# Patient Record
Sex: Male | Born: 1970 | Race: Black or African American | Hispanic: No | Marital: Single | Smoking: Never smoker
Health system: Southern US, Community
[De-identification: ages and names within clinical notes are randomized; demographics above are authoritative.]

## PROBLEM LIST (undated history)

## (undated) DIAGNOSIS — E119 Type 2 diabetes mellitus without complications: Secondary | ICD-10-CM

## (undated) HISTORY — PX: HERNIA REPAIR: SHX51

## (undated) HISTORY — PX: OTHER SURGICAL HISTORY: SHX169

---

## 2014-08-05 ENCOUNTER — Ambulatory Visit: Payer: Self-pay

## 2014-08-05 ENCOUNTER — Other Ambulatory Visit: Payer: Self-pay | Admitting: Occupational Medicine

## 2014-08-05 DIAGNOSIS — M25571 Pain in right ankle and joints of right foot: Secondary | ICD-10-CM
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
## 2016-07-14 ENCOUNTER — Encounter (HOSPITAL_BASED_OUTPATIENT_CLINIC_OR_DEPARTMENT_OTHER): Payer: Self-pay | Admitting: *Deleted

## 2016-07-14 ENCOUNTER — Emergency Department (HOSPITAL_BASED_OUTPATIENT_CLINIC_OR_DEPARTMENT_OTHER)
Admission: EM | Admit: 2016-07-14 | Discharge: 2016-07-14 | Disposition: A | Discharge status: Home / Self Care | Payer: Managed Care, Other (non HMO) | Attending: Emergency Medicine | Admitting: Emergency Medicine

## 2016-07-14 DIAGNOSIS — T1591XA Foreign body on external eye, part unspecified, right eye, initial encounter: Secondary | ICD-10-CM | POA: Insufficient documentation

## 2016-07-14 DIAGNOSIS — Y999 Unspecified external cause status: Secondary | ICD-10-CM | POA: Insufficient documentation

## 2016-07-14 DIAGNOSIS — E119 Type 2 diabetes mellitus without complications: Secondary | ICD-10-CM | POA: Diagnosis not present

## 2016-07-14 DIAGNOSIS — Y929 Unspecified place or not applicable: Secondary | ICD-10-CM | POA: Insufficient documentation

## 2016-07-14 DIAGNOSIS — X58XXXA Exposure to other specified factors, initial encounter: Secondary | ICD-10-CM | POA: Insufficient documentation

## 2016-07-14 DIAGNOSIS — Y939 Activity, unspecified: Secondary | ICD-10-CM | POA: Diagnosis not present

## 2016-07-14 DIAGNOSIS — H5711 Ocular pain, right eye: Secondary | ICD-10-CM | POA: Diagnosis present

## 2016-07-14 HISTORY — DX: Type 2 diabetes mellitus without complications: E11.9

## 2016-07-14 MED ORDER — TETRACAINE HCL 0.5 % OP SOLN
1.0000 [drp] | Freq: Once | OPHTHALMIC | Status: AC
  Administered 2016-07-14: 1 [drp] via OPHTHALMIC
  Filled 2016-07-14: qty 4

## 2016-07-14 MED ORDER — FLUORESCEIN SODIUM 1 MG OP STRP
1.0000 | ORAL_STRIP | Freq: Once | OPHTHALMIC | Status: AC
  Administered 2016-07-14: 1 via OPHTHALMIC
  Filled 2016-07-14: qty 1

## 2016-07-14 NOTE — ED Triage Notes (Signed)
Pt woke up this am feeling like "something is in my eye).  Pt denies any drainage or trauma.  It appears as though an area of the sclera is covering a little part of the iris ( white over some of the brown)
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
## 2021-12-05 ENCOUNTER — Other Ambulatory Visit (HOSPITAL_COMMUNITY): Payer: Self-pay | Admitting: Cardiovascular Disease

## 2021-12-05 ENCOUNTER — Other Ambulatory Visit: Payer: Self-pay | Admitting: Cardiovascular Disease

## 2021-12-05 DIAGNOSIS — R079 Chest pain, unspecified: Secondary | ICD-10-CM

## 2021-12-18 ENCOUNTER — Ambulatory Visit (HOSPITAL_COMMUNITY): Payer: Managed Care, Other (non HMO)

## 2021-12-18 ENCOUNTER — Ambulatory Visit (HOSPITAL_COMMUNITY): Admission: RE | Admit: 2021-12-18 | Payer: Managed Care, Other (non HMO) | Source: Ambulatory Visit

## 2021-12-28 ENCOUNTER — Ambulatory Visit (HOSPITAL_COMMUNITY)
Admission: RE | Admit: 2021-12-28 | Discharge: 2021-12-28 | Disposition: A | Discharge status: Home / Self Care | Payer: Self-pay | Source: Ambulatory Visit | Attending: Family Medicine | Admitting: Family Medicine

## 2021-12-28 ENCOUNTER — Other Ambulatory Visit: Payer: Self-pay

## 2021-12-28 DIAGNOSIS — R079 Chest pain, unspecified: Secondary | ICD-10-CM | POA: Insufficient documentation

## 2023-02-14 IMAGING — CT CT CARDIAC CORONARY ARTERY CALCIUM SCORE
3 series · 14 of 20 positions shown, 15 images · non-contrast
Comparison: CT November 02, 2018
COMPARISON: CT November 02, 2018

Addendum:
:
The following report is an over-read performed by radiologist Dr.
Eime Sta Ana [REDACTED] on December 28, 2021. This
over-read does not include interpretation of cardiac or coronary
anatomy or pathology. The coronary calcium score interpretation by
the cardiologist is attached.
TECHNIQUE: A gated, non-contrast computed tomography scan of the heart was
performed using 3mm slice thickness. Axial images were analyzed on a
dedicated workstation. Calcium scoring of the coronary arteries was
performed using the Agatston method.

[Series 3: ax ca scr 70% (id) · axial · 0.39mm/px · z∈[+1306,+1430]mm · 6 of 88 slices shown]
[im 13/88  vessel]
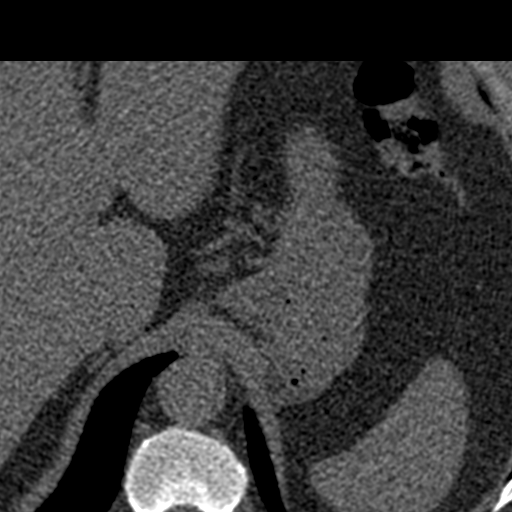
[im 25/88  vessel]
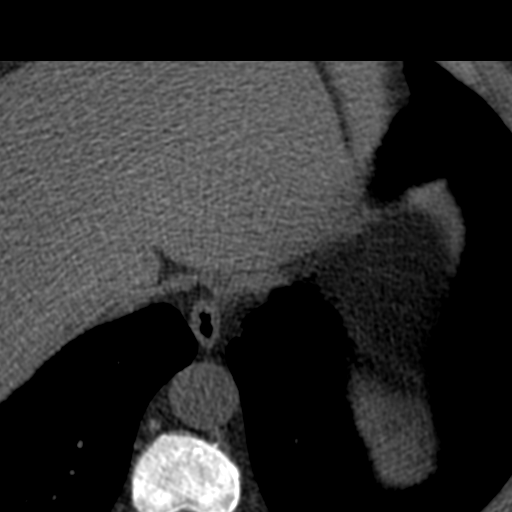
[im 38/88  vessel]
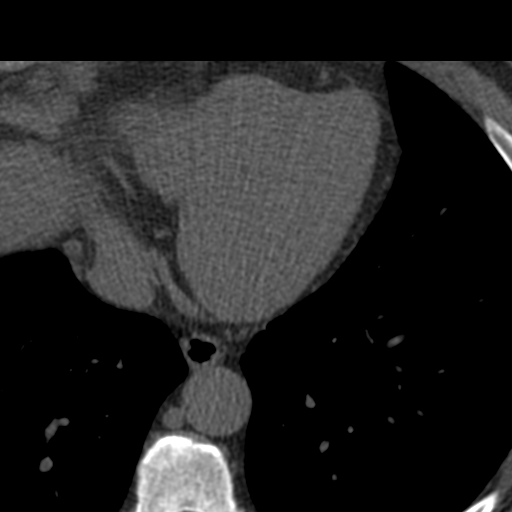
[im 50/88  vessel]
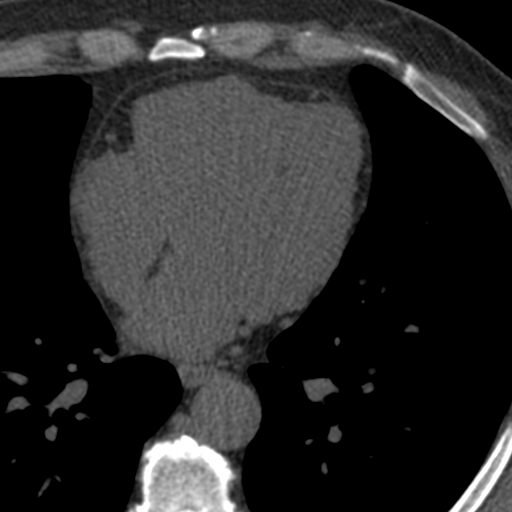
[im 63/88  vessel]
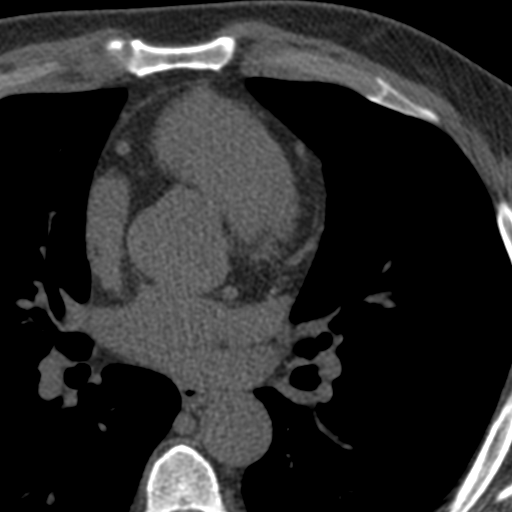
[im 75/88  vessel]
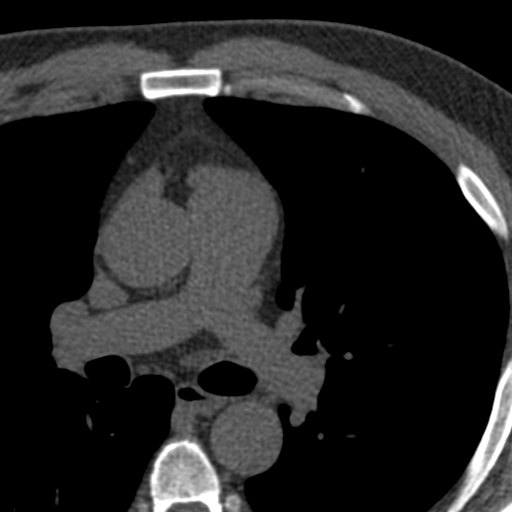

[Series 4: ax st · axial · 0.71mm/px · z∈[+1314,+1419]mm · 4 of 59 slices shown, 5 images]
[im 12/59  vessel]
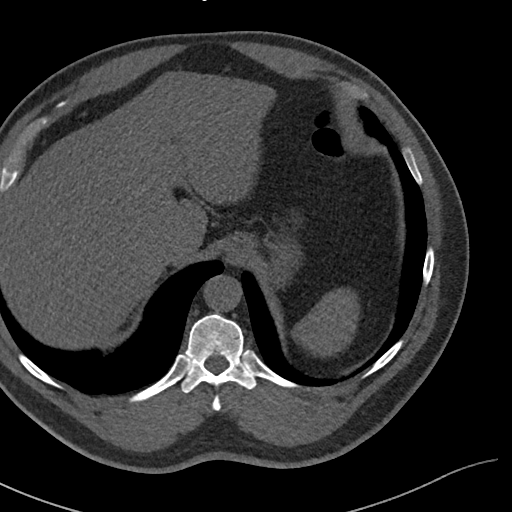
[im 12/59  lung]
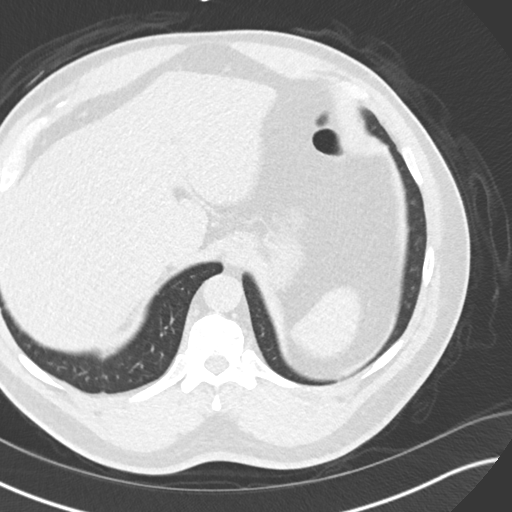
[im 24/59  vessel]
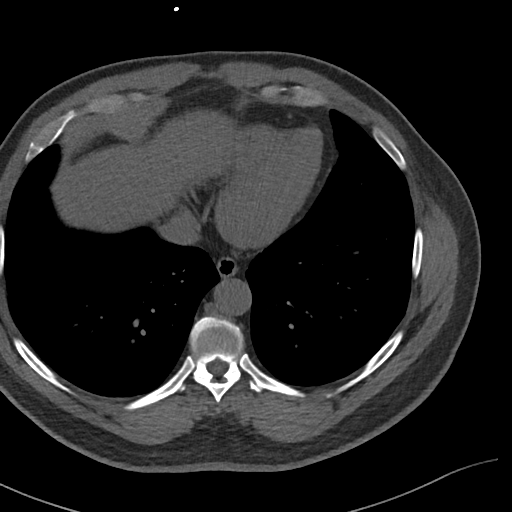
[im 35/59  vessel]
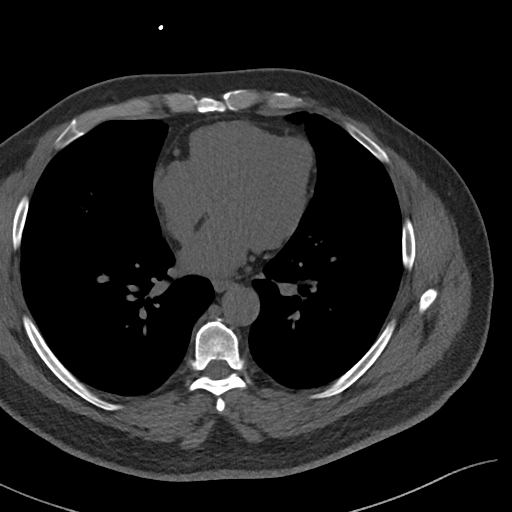
[im 47/59  vessel]
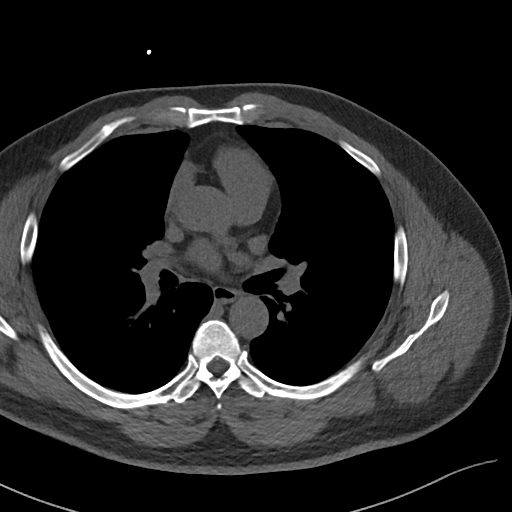

[Series 5: ax lung · axial · 0.71mm/px · z∈[+1314,+1419]mm · 4 of 59 slices shown]
[im 12/59  lung]
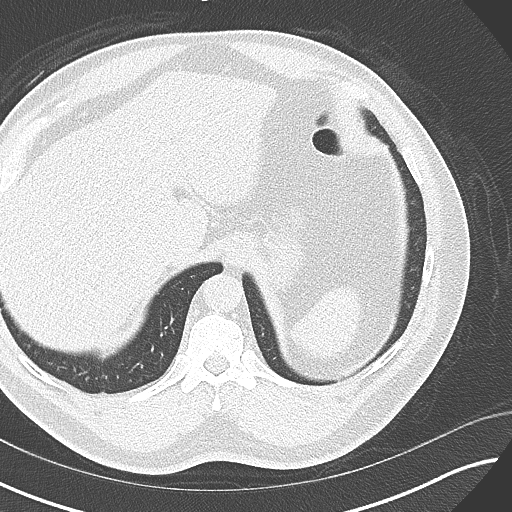
[im 24/59  lung]
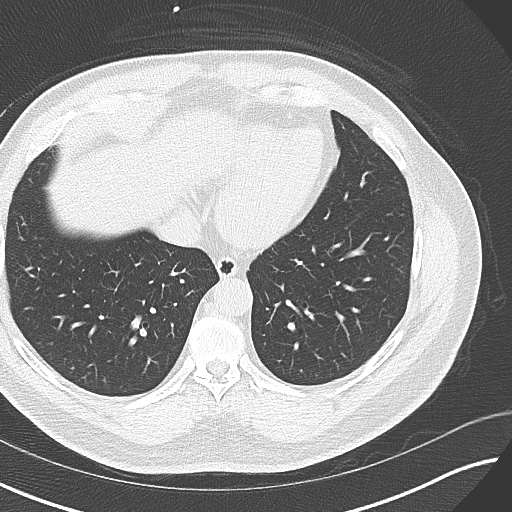
[im 35/59  lung]
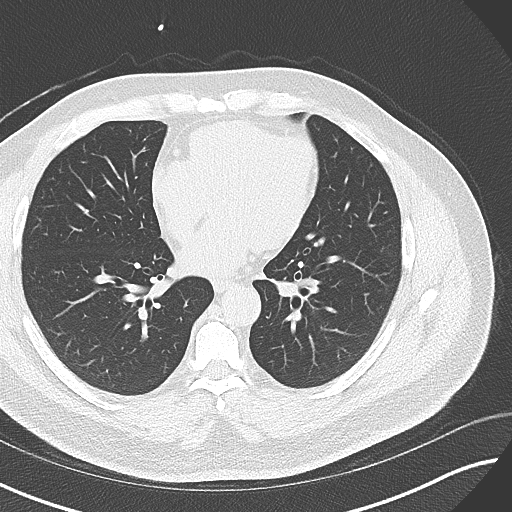
[im 47/59  lung]
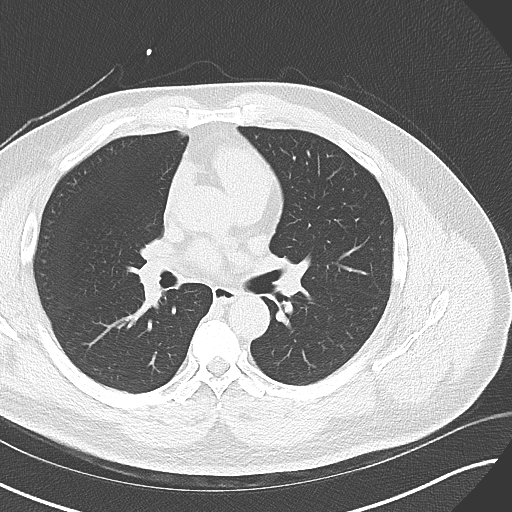

[14 of 20 positions shown; findings below may reference images not displayed]

FINDINGS: Vascular: No acute non-cardiac vascular finding.

Mediastinum/Nodes: No pathologically enlarged mediastinal, or hilar
lymph nodes, noting limited sensitivity for the detection of hilar
adenopathy on this noncontrast study. Visualized portions of the
esophagus are grossly unremarkable

Lungs/Pleura: Within the visualized portions of the thorax there are
no suspicious appearing pulmonary nodules or masses, there is no
acute
consolidative airspace disease, no pleural effusions and no
pneumothorax

Upper Abdomen: Right adrenal nodule measures 16 mm demonstrating
Hounsfield units of 4 consistent with a benign adenoma, and similar
in size to CT November 02, 2018. No acute abnormality.

Musculoskeletal: Thoracic spondylosis. There are no aggressive
appearing lytic or blastic lesions noted in the visualized portions
of the skeleton.
IMPRESSION: No significant incidental noncardiac finding noted in the thorax.

ADDENDUM:
Cardiovascular Disease Risk stratification

EXAM:
Coronary Calcium Score
FINDINGS: Coronary arteries: Normal origins.

Coronary Calcium Score:

Left main: 0

Left anterior descending artery:

Left circumflex artery: 0

Right coronary artery:

Total: 93

Percentile: 86

Pericardium: Normal.

Ascending Aorta: Normal caliber.

Non-cardiac: See separate report from [REDACTED].
IMPRESSION: Coronary calcium score of 93. This was 86 percentile for age-,
race-, and sex-matched controls.



If CAC=0, it is reasonable to withhold statin therapy and reassess
in 5 to 10 years, as long as higher risk conditions are absent
(diabetes mellitus, family history of premature CHD in first degree
relatives (males <55 years; females <65 years), cigarette smoking,
or LDL >=190 mg/dL).

If CAC is 1 to 99, it is reasonable to initiate statin therapy for
patients >=55 years of age.

If CAC is >=100 or >=75th percentile, it is reasonable to initiate
statin therapy at any age.

Cardiology referral should be considered for patients with CAC
scores >=400 or >=75th percentile.

*2604 AHA/ACC/AACVPR/AAPA/ABC/BEJKO/KNUDSON/TANU/Caplan/GARBER/BENONIE/TUAN
Guideline on the Management of Blood Cholesterol: A Report of the
American College of Cardiology/American Heart Association Task Force
on Clinical Practice Guidelines. J Am Coll Cardiol.
9031;73(24):4832-4553.

Christine Laure Stive, DO [REDACTED]

*** End of Addendum ***
:
The following report is an over-read performed by radiologist Dr.
Eime Sta Ana [REDACTED] on December 28, 2021. This
over-read does not include interpretation of cardiac or coronary
anatomy or pathology. The coronary calcium score interpretation by
the cardiologist is attached.
FINDINGS: Vascular: No acute non-cardiac vascular finding.

Mediastinum/Nodes: No pathologically enlarged mediastinal, or hilar
lymph nodes, noting limited sensitivity for the detection of hilar
adenopathy on this noncontrast study. Visualized portions of the
esophagus are grossly unremarkable

Lungs/Pleura: Within the visualized portions of the thorax there are
no suspicious appearing pulmonary nodules or masses, there is no
acute
consolidative airspace disease, no pleural effusions and no
pneumothorax

Upper Abdomen: Right adrenal nodule measures 16 mm demonstrating
Hounsfield units of 4 consistent with a benign adenoma, and similar
in size to CT November 02, 2018. No acute abnormality.

Musculoskeletal: Thoracic spondylosis. There are no aggressive
appearing lytic or blastic lesions noted in the visualized portions
of the skeleton.
IMPRESSION: No significant incidental noncardiac finding noted in the thorax.

## 2023-06-22 ENCOUNTER — Encounter (HOSPITAL_BASED_OUTPATIENT_CLINIC_OR_DEPARTMENT_OTHER): Payer: Self-pay | Admitting: Urology

## 2023-06-22 ENCOUNTER — Emergency Department (HOSPITAL_BASED_OUTPATIENT_CLINIC_OR_DEPARTMENT_OTHER)
Admission: EM | Admit: 2023-06-22 | Discharge: 2023-06-22 | Disposition: A | Discharge status: Home / Self Care | Payer: BC Managed Care – PPO | Attending: Emergency Medicine | Admitting: Emergency Medicine

## 2023-06-22 ENCOUNTER — Emergency Department (HOSPITAL_BASED_OUTPATIENT_CLINIC_OR_DEPARTMENT_OTHER): Payer: BC Managed Care – PPO

## 2023-06-22 ENCOUNTER — Other Ambulatory Visit: Payer: Self-pay

## 2023-06-22 DIAGNOSIS — I471 Supraventricular tachycardia, unspecified: Secondary | ICD-10-CM | POA: Insufficient documentation

## 2023-06-22 DIAGNOSIS — R7989 Other specified abnormal findings of blood chemistry: Secondary | ICD-10-CM

## 2023-06-22 DIAGNOSIS — R079 Chest pain, unspecified: Secondary | ICD-10-CM | POA: Diagnosis present

## 2023-06-22 LAB — CBC
HCT: 50.2 % (ref 39.0–52.0)
Hemoglobin: 16.6 g/dL (ref 13.0–17.0)
MCH: 28.9 pg (ref 26.0–34.0)
MCHC: 33.1 g/dL (ref 30.0–36.0)
MCV: 87.3 fL (ref 80.0–100.0)
Platelets: 216 10*3/uL (ref 150–400)
RBC: 5.75 MIL/uL (ref 4.22–5.81)
RDW: 14.2 % (ref 11.5–15.5)
WBC: 8.7 10*3/uL (ref 4.0–10.5)
nRBC: 0 % (ref 0.0–0.2)

## 2023-06-22 LAB — BASIC METABOLIC PANEL
Anion gap: 12 (ref 5–15)
BUN: 17 mg/dL (ref 6–20)
CO2: 24 mmol/L (ref 22–32)
Calcium: 9.2 mg/dL (ref 8.9–10.3)
Chloride: 100 mmol/L (ref 98–111)
Creatinine, Ser: 1.17 mg/dL (ref 0.61–1.24)
GFR, Estimated: >60 mL/min (ref >60)
Glucose, Bld: 217 mg/dL — ABNORMAL HIGH (ref 70–99)
Potassium: 3.3 mmol/L — ABNORMAL LOW (ref 3.5–5.1)
Sodium: 136 mmol/L (ref 135–145)

## 2023-06-22 LAB — TROPONIN I (HIGH SENSITIVITY)
Troponin I (High Sensitivity): 5 ng/L (ref <18)
Troponin I (High Sensitivity): 35 ng/L — ABNORMAL HIGH (ref <18)
Troponin I (High Sensitivity): 63 ng/L — ABNORMAL HIGH (ref <18)

## 2023-06-22 LAB — MAGNESIUM: Magnesium: 2 mg/dL (ref 1.7–2.4)

## 2023-06-22 MED ORDER — ADENOSINE 6 MG/2ML IV SOLN
INTRAVENOUS | Status: AC
  Filled 2023-06-22: qty 2

## 2023-06-22 MED ORDER — METOPROLOL SUCCINATE ER 25 MG PO TB24: 25.0000 mg | ORAL_TABLET | Freq: Every day | ORAL | 1 refills | Status: AC

## 2023-06-22 MED ORDER — METOPROLOL TARTRATE 5 MG/5ML IV SOLN
INTRAVENOUS | Status: AC
  Filled 2023-06-22: qty 5

## 2023-06-22 MED ORDER — METOPROLOL TARTRATE 5 MG/5ML IV SOLN
INTRAVENOUS | Status: AC | PRN
Start: 2023-06-22 — End: 2023-06-22
  Administered 2023-06-22: 5 mg via INTRAVENOUS

## 2023-06-22 NOTE — ED Notes (Signed)
ED Provider at bedside. 

## 2023-06-22 NOTE — ED Notes (Signed)
EDP at bedside and had pt  perform valsalva maneuver by blowing into syringe, while staff lifted BLE to 45 degree angle; HR decreased to 90s NSR and pt sts he feels better

## 2023-06-22 NOTE — Discharge Instructions (Addendum)
1.  Follow-up with your cardiologist for recheck of your symptoms and blood tests as soon as possible.  Please call their office tomorrow morning to discuss your visit to the emergency department and request a follow-up appointment.  2.  You have a condition called SVT.  This stands for supraventricular tachycardia.  This is a condition that can come and go. It not typically dangerous, but requires further treatment if it is recurrent or lasts for more than short periods of time.  Please try to avoid caffeine, alcohol, and nicotine or smoking, which are all stimulants and can irritate the heart  3.  Return to the emergency department immediately if you have recurrence of this condition and you are feeling shortness of breath, chest pain, lightheadedness or other concerning symptoms.    Review the instructions about SVT.  There are some things you can try at home to see if you can resolve it before immediately coming into the hospital.  However, if it is persisting and you feel the symptoms you should return immediately.  4.  You have been prescribed a medication called Toprol to start taking.  Monitor your heart rate and your blood pressure.  This will make your heart rate slowed down and your blood pressure lower.  If your heart rate is too slow or your blood pressure is too low you may feel lightheaded and dizzy.  Do not take the medication if you are experiencing these symptoms.

## 2023-06-22 NOTE — ED Triage Notes (Addendum)
Pt states left sided chest pain that started appox 1 hr pta , HR 170 on time of triage, pt diaphoretic  Provider made aware of VS  No cardiac history

## 2023-06-22 NOTE — ED Notes (Signed)
Graham crackers, PB and ginger ale provided to pt

## 2023-06-22 NOTE — ED Provider Notes (Signed)
[place] [facility] Provider Note   CSN: [id number] Arrival date & time: 06/22/23  1349     History  Chief Complaint  Patient presents with   Chest Pain    Christopher Vang is a 52 y.o. male.  HPI Patient reports he was out mowing his lawn when he started to feel lightheaded and some chest pain on the left side.  He reports this type of thing has happened before.  He gets a feeling of his heart racing and lightheaded.  He reports that usually resolves on its own pretty quickly.  He ports this is lasted longer.  Patient reports that he has been seen by cardiology.  He reports he has worn a heart monitor and they never detected anything.Marland Kitchen  He also reports he has had a cardiac stress test.  Patient reports that came back normal and he was told that his heart was good and that he was not experiencing heart problems when he was getting his symptoms.    Home Medications Prior to Admission medications   Not on File      Allergies    Patient has no known allergies.    Review of Systems   Review of Systems  Physical Exam Updated Vital Signs BP 109/86   Pulse 85   Temp 98 F (36.7 C)   Resp 20   Ht 5\' 5"  (1.651 m)   Wt 102.1 kg   SpO2 100%   BMI 37.44 kg/m  Physical Exam Constitutional:      Comments: Alert and nontoxic.  Diaphoretic.  No respiratory distress at rest.  HENT:     Mouth/Throat:     Mouth: Mucous membranes are moist.     Pharynx: Oropharynx is clear.  Eyes:     Extraocular Movements: Extraocular movements intact.  Cardiovascular:     Comments: Extreme tachycardia regular. Pulmonary:     Effort: Pulmonary effort is normal.     Breath sounds: Normal breath sounds.  Abdominal:     General: There is no distension.     Palpations: Abdomen is soft.     Tenderness: There is no abdominal tenderness.  Musculoskeletal:        General: No swelling. Normal range of motion.     Right lower leg: No edema.     Left lower leg:  No edema.  Skin:    General: Skin is warm.     Comments: Profusely diaphoretic.  Neurological:     General: No focal deficit present.     Mental Status: He is oriented to person, place, and time.     Motor: No weakness.     Coordination: Coordination normal.  Psychiatric:        Mood and Affect: Mood normal.     ED Results / Procedures / Treatments   Labs (all labs ordered are listed, but only abnormal results are displayed) Labs Reviewed  BASIC METABOLIC PANEL - Abnormal; Notable for the following components:      Result Value   Potassium 3.3 (*)    Glucose, Bld 217 (*)    All other components within normal limits  CBC  MAGNESIUM  TROPONIN I (HIGH SENSITIVITY)  TROPONIN I (HIGH SENSITIVITY)    EKG EKG Interpretation Date/Time:  Sunday June 22 2023 14:09:45 EDT Ventricular Rate:  94 PR Interval:  239 QRS Duration:  98 QT Interval:  335 QTC Calculation: 419 R Axis:   81  Text Interpretation: Sinus rhythm Prolonged PR  interval Probable left atrial enlargement Minimal ST depression, inferior leads Baseline wander in lead(s) V2 V4 V5 V6 Converted to SR, no acute ischemic appearance Confirmed by Arby Barrette [phone]) on 06/22/2023 2:43:46 PM  Radiology DG Chest Port 1 View  Result Date: 06/22/2023 CLINICAL DATA:  Left-sided chest pain for 1 hour. EXAM: PORTABLE CHEST 1 VIEW COMPARISON:  Chest radiograph dated 11/02/2018 FINDINGS: The heart size and mediastinal contours are within normal limits. Both lungs are clear. Chronic deformity of the right clavicle is redemonstrated. Defibrillator pads overlie the chest. IMPRESSION: No active cardiopulmonary disease. Electronically Signed   By: Romona Curls M.D.   On: 06/22/2023 14:55    Procedures Procedures   CRITICAL CARE Performed by: Arby Barrette   Total critical care time: 30 minutes  Critical care time was exclusive of separately billable procedures and treating other patients.  Critical care was necessary to  treat or prevent imminent or life-threatening deterioration.  Critical care was time spent personally by me on the following activities: development of treatment plan with patient and/or surrogate as well as nursing, discussions with consultants, evaluation of patient's response to treatment, examination of patient, obtaining history from patient or surrogate, ordering and performing treatments and interventions, ordering and review of laboratory studies, ordering and review of radiographic studies, pulse oximetry and re-evaluation of patient's condition.  Medications Ordered in ED Medications  metoprolol tartrate (LOPRESSOR) injection ( Intravenous Canceled Entry 06/22/23 1430)    ED Course/ Medical Decision Making/ A&P                                 Medical Decision Making Amount and/or Complexity of Data Reviewed Labs: ordered. Radiology: ordered.  Risk Prescription drug management.   Patient presents with chest discomfort and profusely diaphoretic.  He has elevated heart rate of the 170s.  Patient reports this was of acute onset.  Patient reports he had symptoms periodically in the past but they spontaneously resolved.  Patient described extensive cardiac evaluation with no diagnosed coronary artery disease or dysrhythmia.  First EKG is consistent with SVT versus A-fib rapid narrow complex 160-170.  Patient pads placed.  Peripheral IVs established.  Placed on oxygen.  Valsalva with leg raise employed.  Successful conversion to sinus rhythm.  Patient tolerated well.  repeat EKG shows sinus rhythm at about 90.  Will administer metoprolol 5 mg IV.  Troponin normal.  EKG does not show ischemic changes.  Patient is pain-free.  Will plan for second troponin.  Dr. Delight Hoh to follow-up on second troponin.  Patient mains pain-free and troponins plateau, patient will be appropriate for discharge.  He has had outpatient cardiac workup and will be able to follow-up.  To date he has not been  diagnosed with coronary artery disease.        Final Clinical Impression(s) / ED Diagnoses Final diagnoses:  SVT (supraventricular tachycardia)    Rx / DC Orders ED Discharge Orders     None         Arby Barrette, MD 06/22/23 1615

## 2023-06-22 NOTE — ED Provider Notes (Signed)
52 yo male presenting with chest pain onset today and SVT, converted with valsalva  Patient has cardiologist Pain free at this time Awaiting 2nd troponin  Physical Exam  BP (!) 131/98   Pulse 80   Temp 98.2 F (36.8 C) (Oral)   Resp 19   Ht 5\' 5"  (1.651 m)   Wt 102.1 kg   SpO2 100%   BMI 37.44 kg/m   Physical Exam  Procedures  Procedures  ED Course / MDM    Medical Decision Making Amount and/or Complexity of Data Reviewed Labs: ordered. Radiology: ordered. ECG/medicine tests: ordered.  Risk Prescription drug management.   Patient was monitored for 7 hours in the ED.  I reviewed his telemetry which did not show any arrhythmic events following his cardioversion from SVT.  He remained in a sinus rhythm.  His repeat EKG now after 7 hours continuous with a normal sinus rhythm not acute ischemic findings.  He has been chest pain-free since his cardioversion.  He did have a very minor increase in his troponin which I suspect is related to demand ischemia from his SVT, but I do not see a significant elevation or increase to raise concern for ACS.    Ultimately I engaged in shared decision making with the patient, and we have opted to discharge him home with close follow-up with his cardiologist.  We discussed some lifestyle modifications to make including discontinuing all nicotine products, cigar smoking, reducing or cutting out caffeine intake, and avoiding overexertion in the heat, as potential triggers for SVT.  Close return precautions were given if you were to have a new or developing chest pain.  He is stable otherwise for discharge   Terald Sleeper, MD 06/22/23 2104

## 2023-06-22 NOTE — ED Notes (Signed)
Gave patient ginger ale

## 2023-07-02 ENCOUNTER — Other Ambulatory Visit (HOSPITAL_COMMUNITY): Payer: Self-pay | Admitting: *Deleted

## 2023-07-02 DIAGNOSIS — R079 Chest pain, unspecified: Secondary | ICD-10-CM

## 2023-07-11 ENCOUNTER — Telehealth (HOSPITAL_COMMUNITY): Payer: Self-pay | Admitting: *Deleted

## 2023-07-11 NOTE — Telephone Encounter (Signed)
Reaching out to patient to offer assistance regarding upcoming cardiac imaging study; pt verbalizes understanding of appt date/time, parking situation and where to check in, pre-test NPO status and medications ordered, and verified current allergies; name and call back number provided for further questions should they arise Johney Frame RN Navigator Cardiac Imaging [facility] [phone] office [phone] cell   Instructions sent via email.

## 2023-07-14 ENCOUNTER — Ambulatory Visit (HOSPITAL_COMMUNITY)
Admission: RE | Admit: 2023-07-14 | Discharge: 2023-07-14 | Disposition: A | Discharge status: Home / Self Care | Payer: BC Managed Care – PPO | Source: Ambulatory Visit | Attending: Family Medicine | Admitting: Family Medicine

## 2023-07-14 DIAGNOSIS — R079 Chest pain, unspecified: Secondary | ICD-10-CM | POA: Diagnosis present

## 2023-07-14 DIAGNOSIS — I2510 Atherosclerotic heart disease of native coronary artery without angina pectoris: Secondary | ICD-10-CM | POA: Diagnosis not present

## 2023-07-14 MED ORDER — NITROGLYCERIN 0.4 MG SL SUBL
0.8000 mg | SUBLINGUAL_TABLET | Freq: Once | SUBLINGUAL | Status: AC
  Administered 2023-07-14: 0.8 mg via SUBLINGUAL

## 2023-07-14 MED ORDER — NITROGLYCERIN 0.4 MG SL SUBL
SUBLINGUAL_TABLET | SUBLINGUAL | Status: AC
  Filled 2023-07-14: qty 2

## 2023-07-14 MED ORDER — IOHEXOL 350 MG/ML SOLN
95.0000 mL | Freq: Once | INTRAVENOUS | Status: AC | PRN
  Administered 2023-07-14: 95 mL via INTRAVENOUS

## 2023-07-14 NOTE — Progress Notes (Signed)
Patient tolerated CT well. Vital signs stable encourage to drink water throughout day.Reasons explained and verbalized understanding. Ambulated steady gait.   

## 2024-05-30 ENCOUNTER — Other Ambulatory Visit: Payer: Self-pay

## 2024-05-30 ENCOUNTER — Emergency Department (HOSPITAL_BASED_OUTPATIENT_CLINIC_OR_DEPARTMENT_OTHER)
Admission: EM | Admit: 2024-05-30 | Discharge: 2024-05-30 | Disposition: A | Discharge status: Other Acute Inpatient Hospital | Payer: Self-pay

## 2024-05-30 ENCOUNTER — Encounter (HOSPITAL_BASED_OUTPATIENT_CLINIC_OR_DEPARTMENT_OTHER): Payer: Self-pay | Admitting: Emergency Medicine

## 2024-05-30 DIAGNOSIS — E1110 Type 2 diabetes mellitus with ketoacidosis without coma: Secondary | ICD-10-CM

## 2024-05-30 DIAGNOSIS — R739 Hyperglycemia, unspecified: Secondary | ICD-10-CM | POA: Insufficient documentation

## 2024-05-30 LAB — HEMOGLOBIN A1C
Hgb A1c MFr Bld: 12.8 % — ABNORMAL HIGH (ref 4.8–5.6)
Mean Plasma Glucose: 320.66 mg/dL

## 2024-05-30 LAB — COMPREHENSIVE METABOLIC PANEL WITH GFR
ALT: 49 U/L — ABNORMAL HIGH (ref 0–44)
AST: 28 U/L (ref 15–41)
Albumin: 5.3 g/dL — ABNORMAL HIGH (ref 3.5–5.0)
Alkaline Phosphatase: 75 U/L (ref 38–126)
Anion gap: 30 — ABNORMAL HIGH (ref 5–15)
BUN: 31 mg/dL — ABNORMAL HIGH (ref 6–20)
CO2: 10 mmol/L — ABNORMAL LOW (ref 22–32)
Calcium: 10.4 mg/dL — ABNORMAL HIGH (ref 8.9–10.3)
Chloride: 90 mmol/L — ABNORMAL LOW (ref 98–111)
Creatinine, Ser: 1.73 mg/dL — ABNORMAL HIGH (ref 0.61–1.24)
GFR, Estimated: 46 mL/min — ABNORMAL LOW (ref >60)
Glucose, Bld: 476 mg/dL — ABNORMAL HIGH (ref 70–99)
Potassium: 5.7 mmol/L — ABNORMAL HIGH (ref 3.5–5.1)
Sodium: 131 mmol/L — ABNORMAL LOW (ref 135–145)
Total Bilirubin: 0.4 mg/dL (ref 0.0–1.2)
Total Protein: 9.3 g/dL — ABNORMAL HIGH (ref 6.5–8.1)

## 2024-05-30 LAB — CBC WITH DIFFERENTIAL/PLATELET
Abs Immature Granulocytes: 0.02 K/uL (ref 0.00–0.07)
Basophils Absolute: 0 K/uL (ref 0.0–0.1)
Basophils Relative: 0 %
Eosinophils Absolute: 0 K/uL (ref 0.0–0.5)
Eosinophils Relative: 0 %
HCT: 54 % — ABNORMAL HIGH (ref 39.0–52.0)
Hemoglobin: 18.5 g/dL — ABNORMAL HIGH (ref 13.0–17.0)
Immature Granulocytes: 0 %
Lymphocytes Relative: 20 %
Lymphs Abs: 1.8 K/uL (ref 0.7–4.0)
MCH: 29.2 pg (ref 26.0–34.0)
MCHC: 34.3 g/dL (ref 30.0–36.0)
MCV: 85.2 fL (ref 80.0–100.0)
Monocytes Absolute: 0.4 K/uL (ref 0.1–1.0)
Monocytes Relative: 5 %
Neutro Abs: 6.7 K/uL (ref 1.7–7.7)
Neutrophils Relative %: 75 %
Platelets: 261 K/uL (ref 150–400)
RBC: 6.34 MIL/uL — ABNORMAL HIGH (ref 4.22–5.81)
RDW: 13.2 % (ref 11.5–15.5)
WBC: 8.9 K/uL (ref 4.0–10.5)
nRBC: 0 % (ref 0.0–0.2)

## 2024-05-30 LAB — CBG MONITORING, ED
Glucose-Capillary: 490 mg/dL — ABNORMAL HIGH (ref 70–99)
Glucose-Capillary: 421 mg/dL — ABNORMAL HIGH (ref 70–99)
Glucose-Capillary: 360 mg/dL — ABNORMAL HIGH (ref 70–99)
Glucose-Capillary: 375 mg/dL — ABNORMAL HIGH (ref 70–99)
Glucose-Capillary: 259 mg/dL — ABNORMAL HIGH (ref 70–99)
Glucose-Capillary: 230 mg/dL — ABNORMAL HIGH (ref 70–99)
Glucose-Capillary: 194 mg/dL — ABNORMAL HIGH (ref 70–99)
Glucose-Capillary: 200 mg/dL — ABNORMAL HIGH (ref 70–99)

## 2024-05-30 LAB — I-STAT VENOUS BLOOD GAS, ED
Acid-base deficit: 18 mmol/L — ABNORMAL HIGH (ref 0.0–2.0)
Bicarbonate: 10.2 mmol/L — ABNORMAL LOW (ref 20.0–28.0)
Calcium, Ion: 1.16 mmol/L (ref 1.15–1.40)
HCT: 54 % — ABNORMAL HIGH (ref 39.0–52.0)
Hemoglobin: 18.4 g/dL — ABNORMAL HIGH (ref 13.0–17.0)
O2 Saturation: 57 %
Potassium: 5.8 mmol/L — ABNORMAL HIGH (ref 3.5–5.1)
Sodium: 132 mmol/L — ABNORMAL LOW (ref 135–145)
TCO2: 11 mmol/L — ABNORMAL LOW (ref 22–32)
pCO2, Ven: 31.2 mmHg — ABNORMAL LOW (ref 44–60)
pH, Ven: 7.123 — CL (ref 7.25–7.43)
pO2, Ven: 39 mmHg (ref 32–45)

## 2024-05-30 LAB — BASIC METABOLIC PANEL WITH GFR
Anion gap: 25 — ABNORMAL HIGH (ref 5–15)
Anion gap: 19 — ABNORMAL HIGH (ref 5–15)
BUN: 27 mg/dL — ABNORMAL HIGH (ref 6–20)
BUN: 24 mg/dL — ABNORMAL HIGH (ref 6–20)
CO2: 10 mmol/L — ABNORMAL LOW (ref 22–32)
CO2: 14 mmol/L — ABNORMAL LOW (ref 22–32)
Calcium: 8.9 mg/dL (ref 8.9–10.3)
Calcium: 8.9 mg/dL (ref 8.9–10.3)
Chloride: 97 mmol/L — ABNORMAL LOW (ref 98–111)
Chloride: 101 mmol/L (ref 98–111)
Creatinine, Ser: 1.55 mg/dL — ABNORMAL HIGH (ref 0.61–1.24)
Creatinine, Ser: 1.35 mg/dL — ABNORMAL HIGH (ref 0.61–1.24)
GFR, Estimated: 53 mL/min — ABNORMAL LOW (ref >60)
GFR, Estimated: >60 mL/min (ref >60)
Glucose, Bld: 365 mg/dL — ABNORMAL HIGH (ref 70–99)
Glucose, Bld: 177 mg/dL — ABNORMAL HIGH (ref 70–99)
Potassium: 5.8 mmol/L — ABNORMAL HIGH (ref 3.5–5.1)
Potassium: 4.2 mmol/L (ref 3.5–5.1)
Sodium: 131 mmol/L — ABNORMAL LOW (ref 135–145)
Sodium: 134 mmol/L — ABNORMAL LOW (ref 135–145)

## 2024-05-30 LAB — BETA-HYDROXYBUTYRIC ACID: Beta-Hydroxybutyric Acid: 2.73 mmol/L — ABNORMAL HIGH (ref 0.05–0.27)

## 2024-05-30 LAB — TROPONIN T, HIGH SENSITIVITY
Troponin T High Sensitivity: <15 ng/L (ref 0–19)
Troponin T High Sensitivity: <15 ng/L (ref 0–19)

## 2024-05-30 LAB — LIPASE, BLOOD: Lipase: 34 U/L (ref 11–51)

## 2024-05-30 MED ORDER — INSULIN REGULAR(HUMAN) IN NACL 100-0.9 UT/100ML-% IV SOLN
INTRAVENOUS | Status: DC
  Administered 2024-05-30: 8 [IU]/h via INTRAVENOUS
  Administered 2024-05-30: 6.5 [IU]/h via INTRAVENOUS
  Administered 2024-05-30: 13 [IU]/h via INTRAVENOUS
  Administered 2024-05-30: 14 [IU]/h via INTRAVENOUS
  Filled 2024-05-30: qty 100

## 2024-05-30 MED ORDER — LACTATED RINGERS IV BOLUS: 1000.0000 mL | INTRAVENOUS | Status: DC

## 2024-05-30 MED ORDER — DEXTROSE 50 % IV SOLN: 0.0000 mL | INTRAVENOUS | Status: DC | PRN

## 2024-05-30 MED ORDER — SODIUM CHLORIDE 0.9 % IV BOLUS
1000.0000 mL | Freq: Once | INTRAVENOUS | Status: AC
  Administered 2024-05-30: 1000 mL via INTRAVENOUS

## 2024-05-30 MED ORDER — LACTATED RINGERS IV SOLN: INTRAVENOUS | Status: DC

## 2024-05-30 MED ORDER — DEXTROSE IN LACTATED RINGERS 5 % IV SOLN: INTRAVENOUS | Status: DC

## 2024-05-30 NOTE — ED Notes (Signed)
 [facility] called for transport 09:19p- may be a while, trucks are running other calls right now

## 2024-05-30 NOTE — ED Notes (Signed)
 Called [facility] for pt admit. [facility] provider will call back EDP at [phone]

## 2024-05-30 NOTE — ED Provider Notes (Signed)
 [facility] Provider Note   CSN: [id number] Arrival date & time: 05/30/24  1224     Patient presents with: Hyperglycemia   Christopher Vang is a 53 y.o. male.    Hyperglycemia Associated symptoms: no abdominal pain, no chest pain, no dysuria, no fever, no shortness of breath and no vomiting        Patient presents because of concern for hyperglycemia.  Patient states has been checking his blood sugar at home for the past 3 weeks.  Has been running high this whole time.  Patient states that he is on Januvia as well as metformin.  Has not missed any of his doses for this.  Patient states has been eating a lot of watermelon, bananas, mangoes.  He did not realize how much sugar was in these.  Patient states he been doing this all summer because he thought was healthy.  Patient feels like his been urinating a lot.  No chest pain or shortness of breath.  No nausea vomit diarrhea.  Patient on times feels like he is producing a lot of saliva.  Otherwise, denies all complaints including exertional chest pain or shortness of breath.  He does endorse some slight burning in his throat that he feels like his acid reflux.  He states he deals with this a lot.  Prior to Admission medications   Medication Sig Start Date End Date Taking? Authorizing Provider  metoprolol  succinate (TOPROL -XL) 25 MG 24 hr tablet Take 1 tablet (25 mg total) by mouth daily. 06/22/23   Armenta Canning, MD    Allergies: Patient has no known allergies.    Review of Systems  Constitutional:  Negative for chills and fever.  HENT:  Negative for ear pain and sore throat.   Eyes:  Negative for pain and visual disturbance.  Respiratory:  Negative for cough and shortness of breath.   Cardiovascular:  Negative for chest pain and palpitations.  Gastrointestinal:  Negative for abdominal pain and vomiting.  Genitourinary:  Negative for dysuria and hematuria.  Musculoskeletal:  Negative  for arthralgias and back pain.  Skin:  Negative for color change and rash.  Neurological:  Negative for seizures and syncope.  All other systems reviewed and are negative.   Updated Vital Signs BP (!) 136/91 (BP Location: Right Arm)   Pulse 72   Temp (!) 97.4 F (36.3 C) (Oral)   Resp 18   Ht 5' 5 (1.651 m)   Wt 91.6 kg   SpO2 100%   BMI 33.61 kg/m   Physical Exam Vitals and nursing note reviewed.  Constitutional:      General: He is not in acute distress.    Appearance: He is well-developed.  HENT:     Head: Normocephalic and atraumatic.  Eyes:     Conjunctiva/sclera: Conjunctivae normal.  Cardiovascular:     Rate and Rhythm: Normal rate and regular rhythm.     Heart sounds: No murmur heard. Pulmonary:     Effort: Pulmonary effort is normal. No respiratory distress.     Breath sounds: Normal breath sounds.  Abdominal:     Palpations: Abdomen is soft.     Tenderness: There is no abdominal tenderness.  Musculoskeletal:        General: No swelling.     Cervical back: Neck supple.  Skin:    General: Skin is warm and dry.     Capillary Refill: Capillary refill takes less than 2 seconds.  Neurological:  Mental Status: He is alert.  Psychiatric:        Mood and Affect: Mood normal.     (all labs ordered are listed, but only abnormal results are displayed) Labs Reviewed  COMPREHENSIVE METABOLIC PANEL WITH GFR - Abnormal; Notable for the following components:      Result Value   Sodium 131 (*)    Potassium 5.7 (*)    Chloride 90 (*)    CO2 10 (*)    Glucose, Bld 476 (*)    BUN 31 (*)    Creatinine, Ser 1.73 (*)    Calcium 10.4 (*)    Total Protein 9.3 (*)    Albumin 5.3 (*)    ALT 49 (*)    GFR, Estimated 46 (*)    Anion gap 30 (*)    All other components within normal limits  CBC WITH DIFFERENTIAL/PLATELET - Abnormal; Notable for the following components:   RBC 6.34 (*)    Hemoglobin 18.5 (*)    HCT 54.0 (*)    All other components within normal  limits  CBG MONITORING, ED - Abnormal; Notable for the following components:   Glucose-Capillary 490 (*)    All other components within normal limits  LIPASE, BLOOD  HEMOGLOBIN A1C  CBG MONITORING, ED  TROPONIN T, HIGH SENSITIVITY    EKG: None  Radiology: No results found.   Procedures   Medications Ordered in the ED  sodium chloride  0.9 % bolus 1,000 mL (0 mLs Intravenous Stopped 05/30/24 1520)  sodium chloride  0.9 % bolus 1,000 mL (0 mLs Intravenous Stopped 05/30/24 1519)                                    Medical Decision Making Amount and/or Complexity of Data Reviewed Labs: ordered.     Patient presents because of concern for hyperglycemia.  Patient states has been checking his blood sugar at home for the past 3 weeks.  Has been running high this whole time.  Patient states that he is on Januvia as well as metformin.  Has not missed any of his doses for this.  Patient states has been eating a lot of watermelon, bananas, mangoes.  He did not realize how much sugar was in these.  Patient states he been doing this all summer because he thought was healthy.  Patient feels like his been urinating a lot.  No chest pain or shortness of breath.  No nausea vomit diarrhea.  Patient on times feels like he is producing a lot of saliva.  Otherwise, denies all complaints including exertional chest pain or shortness of breath.  He does endorse some slight burning in his throat that he feels like his acid reflux.  He states he deals with this a lot.  Patient currenlty taking januvia 100 mg once a day and Metformin 750 mg 1 tablet BID   Upon exam, patient hemodynamically stable.  A and O x 3 GCS 15.  No focal deficits.   Soft and benign abdomen.  No rebound guarding or tenderness.  Glucose of 490.  Add on A1c.  Start patient on 2 L of fluid.  Pending CMP at time of signout.  Very little concerns to come to DKA.  No concerns or HHS.  Will continue to give fluids and recheck glucose.  Plan  will be for him to follow-up with his PCP for adjustment of his medication   Patient  did have some slight peaked T waves on EKG.  He was endorsing some burning sensation in his neck area that he felt like was likely GERD related.  No STEMI.  Did add on troponin.  Pending potassium levels at time of signout.       Final diagnoses:  Hyperglycemia    ED Discharge Orders     None          Simon Lavonia SAILOR, MD 05/30/24 1527

## 2024-05-30 NOTE — ED Triage Notes (Signed)
 Pt reports elevated glucose x about 3 wks; he has been eating watermelon, mango and bananas not realizing they would increase glucose

## 2024-05-30 NOTE — Discharge Instructions (Addendum)
 SABRA

## 2024-05-30 NOTE — ED Notes (Signed)
 VBG results given to Dr Patsey.

## 2024-05-30 NOTE — ED Notes (Signed)
 Checked CBG 200, informed Carlin

## 2024-05-30 NOTE — ED Notes (Signed)
 Re-called [facility] for consult to admit pt.

## 2024-05-30 NOTE — ED Notes (Signed)
 ED Provider at bedside.

## 2024-05-30 NOTE — ED Provider Notes (Addendum)
  Physical Exam  BP (!) 136/91 (BP Location: Right Arm)   Pulse 72   Temp (!) 97.4 F (36.3 C) (Oral)   Resp 18   Ht 5' 5 (1.651 m)   Wt 91.6 kg   SpO2 100%   BMI 33.61 kg/m   Physical Exam  Procedures  Procedures  ED Course / MDM   Clinical Course as of 05/30/24 2113  Sun May 30, 2024  1637 Still have not heard back from [facility] transfer [facility].  Has now been an hour. [NP]  1714 Discussed with Dr. Merlyn through the transfer line.  Patient has been accepted but stated they were also waiting on accepting name. [NP]  1920 Or transfer services now called back.  Has excepted in transfer officially now but would like a new set of labs drawn.  Would like a callback after. [NP]  2113 Discussed with transfer service again and gave new blood work.  Now patient can come over.  CRITICAL CARE Performed by: [place] Total critical care time: 45 minutes Critical care time was exclusive of separately billable procedures and treating other patients. Critical care was necessary to treat or prevent imminent or life-threatening deterioration. Critical care was time spent personally by me on the following activities: development of treatment plan with patient and/or surrogate as well as nursing, discussions with consultants, evaluation of patient's response to treatment, examination of patient, obtaining history from patient or surrogate, ordering and performing treatments and interventions, ordering and review of laboratory studies, ordering and review of radiographic studies, pulse oximetry and re-evaluation of patient's condition.  [NP]    Clinical Course User Index [NP] River Rankin, MD   Medical Decision Making Amount and/or Complexity of Data Reviewed Labs: ordered.  Risk Prescription drug management.   Received in signout.  Hyperglycemia.  Did have some small peak T waves on EKG.  On metformin and Jardiance.  Sugar of 400.  However found to have a bicarb of 10  Anion  gap of 30. Creatinine up to 1.7 from a baseline of 1.  Also potassium 5.7.  This is all potentially a DKA.  Will start on insulin  drip and fluid bolus have been given.  Insulin  should help treat potassium.  Will admit patient.  Patient request [facility].  Will discuss with transfer service.   Venous blood gases now resulted.  pH is 7.12 with a bicarb of 11.  Transfer line had called back and they are checking on bed availability   Patient reexamined and stable for transfer      River Rankin, MD 05/30/24 1643      River Rankin, MD 05/30/24 2112    River Rankin, MD 05/30/24 2258
